# Patient Record
Sex: Female | Born: 1960 | Race: White | Hispanic: No | Marital: Married | Smoking: Never smoker
Health system: Southern US, Community
[De-identification: ages and names within clinical notes are randomized; demographics above are authoritative.]

---
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
## 1999-02-08 HISTORY — PX: ABDOMINAL HYSTERECTOMY: SHX81

## 2004-02-08 ENCOUNTER — Encounter (INDEPENDENT_AMBULATORY_CARE_PROVIDER_SITE_OTHER): Payer: Self-pay | Admitting: Family Medicine

## 2004-02-08 LAB — CONVERTED CEMR LAB: Pap Smear: NORMAL

## 2004-04-13 ENCOUNTER — Ambulatory Visit: Payer: Self-pay | Admitting: Family Medicine

## 2004-04-19 ENCOUNTER — Ambulatory Visit (HOSPITAL_COMMUNITY): Admission: RE | Admit: 2004-04-19 | Discharge: 2004-04-19 | Payer: Self-pay | Admitting: Family Medicine

## 2004-05-20 ENCOUNTER — Ambulatory Visit: Payer: Self-pay | Admitting: Family Medicine

## 2004-11-18 ENCOUNTER — Ambulatory Visit: Payer: Self-pay | Admitting: Family Medicine

## 2005-07-07 ENCOUNTER — Ambulatory Visit (HOSPITAL_COMMUNITY): Admission: RE | Admit: 2005-07-07 | Discharge: 2005-07-07 | Payer: Self-pay | Admitting: Internal Medicine

## 2006-01-17 ENCOUNTER — Ambulatory Visit: Payer: Self-pay | Admitting: Family Medicine

## 2006-01-21 ENCOUNTER — Encounter: Payer: Self-pay | Admitting: Family Medicine

## 2006-01-21 DIAGNOSIS — D649 Anemia, unspecified: Secondary | ICD-10-CM | POA: Insufficient documentation

## 2006-01-21 DIAGNOSIS — Z85828 Personal history of other malignant neoplasm of skin: Secondary | ICD-10-CM

## 2006-01-21 DIAGNOSIS — J309 Allergic rhinitis, unspecified: Secondary | ICD-10-CM | POA: Insufficient documentation

## 2006-09-18 ENCOUNTER — Ambulatory Visit: Payer: Self-pay | Admitting: Internal Medicine

## 2006-09-18 DIAGNOSIS — L255 Unspecified contact dermatitis due to plants, except food: Secondary | ICD-10-CM | POA: Insufficient documentation

## 2006-09-21 ENCOUNTER — Encounter (INDEPENDENT_AMBULATORY_CARE_PROVIDER_SITE_OTHER): Payer: Self-pay | Admitting: Internal Medicine

## 2006-09-21 ENCOUNTER — Telehealth (INDEPENDENT_AMBULATORY_CARE_PROVIDER_SITE_OTHER): Payer: Self-pay | Admitting: *Deleted

## 2007-02-08 ENCOUNTER — Encounter: Payer: Self-pay | Admitting: Family Medicine

## 2008-08-06 ENCOUNTER — Ambulatory Visit: Payer: Self-pay | Admitting: Internal Medicine

## 2008-08-06 DIAGNOSIS — Z780 Asymptomatic menopausal state: Secondary | ICD-10-CM | POA: Insufficient documentation

## 2008-08-06 DIAGNOSIS — M67919 Unspecified disorder of synovium and tendon, unspecified shoulder: Secondary | ICD-10-CM | POA: Insufficient documentation

## 2008-08-06 DIAGNOSIS — M719 Bursopathy, unspecified: Secondary | ICD-10-CM

## 2008-08-07 LAB — CONVERTED CEMR LAB
Cholesterol: 195 mg/dL (ref 0–200)
HDL: 65 mg/dL (ref >39)

## 2008-08-12 ENCOUNTER — Ambulatory Visit (HOSPITAL_COMMUNITY): Admission: RE | Admit: 2008-08-12 | Discharge: 2008-08-12 | Payer: Self-pay | Admitting: Internal Medicine

## 2008-08-22 ENCOUNTER — Encounter: Payer: Self-pay | Admitting: Gastroenterology

## 2008-08-27 ENCOUNTER — Ambulatory Visit (HOSPITAL_COMMUNITY): Admission: RE | Admit: 2008-08-27 | Discharge: 2008-08-27 | Payer: Self-pay | Admitting: Gastroenterology

## 2008-08-27 ENCOUNTER — Ambulatory Visit: Payer: Self-pay | Admitting: Gastroenterology

## 2008-08-29 ENCOUNTER — Ambulatory Visit: Payer: Self-pay | Admitting: Internal Medicine

## 2008-08-29 DIAGNOSIS — R21 Rash and other nonspecific skin eruption: Secondary | ICD-10-CM

## 2010-02-28 ENCOUNTER — Encounter: Payer: Self-pay | Admitting: Internal Medicine

## 2010-03-09 NOTE — Letter (Signed)
Summary: rpc chart  rpc chart   Imported By: Curtis Sites 11/11/2009 17:01:38  _____________________________________________________________________  External Attachment:    Type:   Image     Comment:   External Document

## 2010-06-22 NOTE — Op Note (Signed)
Brooke Doyle, Brooke Doyle                ACCOUNT NO.:  [id number]   MEDICAL RECORD NO.:  [id number]          PATIENT TYPE:  AMB   LOCATION:  DAY                           FACILITY:  [facility]   PHYSICIAN:  Kassie Mends, M.D.      DATE OF BIRTH:  [birth year]   DATE OF PROCEDURE:  08/27/2008  DATE OF DISCHARGE:                               OPERATIVE REPORT   REFERRING PHYSICIAN:  Erle Crocker, MD   PROCEDURE:  Colonoscopy.   INDICATION FOR EXAM:  Brooke Doyle is a 50 year old female, who presents  for average risk colon cancer screening.   FINDINGS:  1. Slightly tortuous colon.  Rare diverticula seen in the transverse      and sigmoid colon.  Otherwise, no polyps, masses, inflammatory      changes, or AVMs seen.  2. Small internal hemorrhoids.  Otherwise, normal retroflex view of      the rectum.   RECOMMENDATIONS:  1. Screening colonoscopy in 10 years.  Would consider a pediatric      colonoscope/overtube.  2. She should follow a high-fiber diet.  She is given a handout on a      high-fiber diet, diverticulosis, and hemorrhoids.   MEDICATIONS:  1. Demerol 50 mg IV.  2. Versed 5 mg IV.  3. Phenergan 12.5 mg IV.   PROCEDURE TECHNIQUE:  Physical exam was performed.  Informed consent was  obtained from the patient explaining the benefits, risks, and  alternatives to the procedure.  The patient was connected to monitor and  placed in left lateral position.  Continuous oxygen was provided by  nasal cannula and IV medicine administered through an indwelling  cannula.  After administration of sedation and rectal exam, the  patient's rectum was intubated and the  scope advanced under direct visualization to the cecum.  The scope was  removed slowly by carefully examining color, texture, anatomy, and  integrity mucosa on the way out.  The patient was recovered in endoscopy  and discharged home in satisfactory condition.      Kassie Mends, M.D.  Electronically Signed     SM/MEDQ  D:   08/27/2008  T:  08/27/2008  Job:  [id number]   cc:   Erle Crocker, M.D.

## 2011-06-06 ENCOUNTER — Other Ambulatory Visit: Payer: Self-pay | Admitting: Obstetrics and Gynecology

## 2011-06-06 DIAGNOSIS — Z1231 Encounter for screening mammogram for malignant neoplasm of breast: Secondary | ICD-10-CM

## 2011-07-08 ENCOUNTER — Other Ambulatory Visit: Payer: Self-pay | Admitting: Obstetrics and Gynecology

## 2011-07-08 ENCOUNTER — Other Ambulatory Visit (HOSPITAL_COMMUNITY): Payer: Self-pay | Admitting: Obstetrics and Gynecology

## 2011-07-08 ENCOUNTER — Ambulatory Visit: Payer: Self-pay

## 2011-07-08 ENCOUNTER — Ambulatory Visit (HOSPITAL_COMMUNITY)
Admission: RE | Admit: 2011-07-08 | Discharge: 2011-07-08 | Disposition: A | Discharge status: Home / Self Care | Payer: BC Managed Care – PPO | Source: Ambulatory Visit | Attending: Obstetrics and Gynecology | Admitting: Obstetrics and Gynecology

## 2011-07-08 DIAGNOSIS — Z1231 Encounter for screening mammogram for malignant neoplasm of breast: Secondary | ICD-10-CM | POA: Insufficient documentation

## 2013-02-25 ENCOUNTER — Ambulatory Visit: Payer: BC Managed Care – PPO | Admitting: Internal Medicine

## 2014-06-10 ENCOUNTER — Other Ambulatory Visit: Payer: Self-pay | Admitting: Obstetrics and Gynecology

## 2014-06-10 DIAGNOSIS — Z1231 Encounter for screening mammogram for malignant neoplasm of breast: Secondary | ICD-10-CM

## 2014-07-25 ENCOUNTER — Ambulatory Visit (HOSPITAL_COMMUNITY)
Admission: RE | Admit: 2014-07-25 | Discharge: 2014-07-25 | Disposition: A | Discharge status: Home / Self Care | Payer: BC Managed Care – PPO | Source: Ambulatory Visit | Attending: Obstetrics and Gynecology | Admitting: Obstetrics and Gynecology

## 2014-07-25 DIAGNOSIS — Z1231 Encounter for screening mammogram for malignant neoplasm of breast: Secondary | ICD-10-CM

## 2014-07-28 ENCOUNTER — Other Ambulatory Visit: Payer: Self-pay | Admitting: Obstetrics and Gynecology

## 2014-07-28 DIAGNOSIS — R928 Other abnormal and inconclusive findings on diagnostic imaging of breast: Secondary | ICD-10-CM

## 2014-08-01 ENCOUNTER — Ambulatory Visit
Admission: RE | Admit: 2014-08-01 | Discharge: 2014-08-01 | Disposition: A | Discharge status: Home / Self Care | Payer: BC Managed Care – PPO | Source: Ambulatory Visit | Attending: Obstetrics and Gynecology | Admitting: Obstetrics and Gynecology

## 2014-08-01 DIAGNOSIS — R928 Other abnormal and inconclusive findings on diagnostic imaging of breast: Secondary | ICD-10-CM

## 2014-08-13 ENCOUNTER — Encounter: Payer: Self-pay | Admitting: *Deleted

## 2014-08-14 ENCOUNTER — Ambulatory Visit (INDEPENDENT_AMBULATORY_CARE_PROVIDER_SITE_OTHER): Payer: BC Managed Care – PPO | Admitting: Cardiology

## 2014-08-14 ENCOUNTER — Encounter: Payer: Self-pay | Admitting: *Deleted

## 2014-08-14 VITALS — BP 99/63 | HR 74 | Ht 65.0 in | Wt 141.0 lb

## 2014-08-14 DIAGNOSIS — M79606 Pain in leg, unspecified: Secondary | ICD-10-CM | POA: Diagnosis not present

## 2014-08-14 DIAGNOSIS — R0789 Other chest pain: Secondary | ICD-10-CM

## 2014-08-14 DIAGNOSIS — Z136 Encounter for screening for cardiovascular disorders: Secondary | ICD-10-CM | POA: Diagnosis not present

## 2014-08-14 NOTE — Patient Instructions (Signed)
Your physician recommends that you schedule a follow-up appointment in: 1 year. You will receive a reminder letter in the mail in about 10 months reminding you to call and schedule your appointment. If you don't receive this letter, please contact our office.

## 2014-08-14 NOTE — Progress Notes (Signed)
Patient ID: Brooke Doyle, female   DOB: 04-Nov-1960, 54 y.o.   MRN: [id number]     Clinical Summary Brooke Doyle is a 54 y.o.female seen today as a new patient for the following medical problems.   1. Leg pain - left leg throbbing pain just above ankle. Noticed one day when she had been on her feet for a long period of time at her job. She questions if it could be circulation related.    2. Chest pain - shooting pain a few months ago, going to jaw - first episode while walking in from playground at work. Opened door, felt shooting like pain that lasted just a few seconds. No other symptoms.   - 2nd episode while in kitchen. Left jaw pain, no other symptoms. Lasted just a few seconds.  - 30 min does fast walking daily, including up and down stairs. Tolerates well without any symptoms  CAD risk factors:  Maternal aunt and uncle with heart diasease       No past medical history on file.   No Known Allergies   No current outpatient prescriptions on file.   No current facility-administered medications for this visit.     Past Surgical History  Procedure Laterality Date  . Abdominal hysterectomy      due to heavy bleeding  . Cesarean section       No Known Allergies    Family History  Problem Relation Age of Onset  . Skin cancer Father   . Breast cancer Other     grandmother     Social History Brooke Doyle reports that she has never smoked. She has never used smokeless tobacco. Brooke Doyle has no alcohol history on file.   Review of Systems CONSTITUTIONAL: No weight loss, fever, chills, weakness or fatigue.  HEENT: Eyes: No visual loss, blurred vision, double vision or yellow sclerae.No hearing loss, sneezing, congestion, runny nose or sore throat.  SKIN: No rash or itching.  CARDIOVASCULAR: per HPI RESPIRATORY: No shortness of breath, cough or sputum.  GASTROINTESTINAL: No anorexia, nausea, vomiting or diarrhea. No abdominal pain or blood.  GENITOURINARY: No  burning on urination, no polyuria NEUROLOGICAL: No headache, dizziness, syncope, paralysis, ataxia, numbness or tingling in the extremities. No change in bowel or bladder control.  MUSCULOSKELETAL: per HPI LYMPHATICS: No enlarged nodes. No history of splenectomy.  PSYCHIATRIC: No history of depression or anxiety.  ENDOCRINOLOGIC: No reports of sweating, cold or heat intolerance. No polyuria or polydipsia.  Marland Kitchen.   Physical Examination Filed Vitals:   08/14/14 1407  BP: 99/63  Pulse: 74   Filed Vitals:   08/14/14 1407  Height: 5\' 5"  (1.651 m)  Weight: 141 lb (63.957 kg)    Gen: resting comfortably, no acute distress HEENT: no scleral icterus, pupils equal round and reactive, no palptable cervical adenopathy,  CV: RRR, no m/r/g, noJVD. 2+ bilateral DP/PT pulses Resp: Clear to auscultation bilaterally GI: abdomen is soft, non-tender, non-distended, normal bowel sounds, no hepatosplenomegaly MSK: extremities are warm, no edema.  Skin: warm, no rash Neuro:  no focal deficits Psych: appropriate affect     Assessment and Plan  1. Leg pain - isolated episode, probably MSK pain. Not consistent with vascular etiology. No further workup at this time  2. Atypical chest pain - noncardiac chest pain symptoms, very limited CAD risk factors. Tolerates moderate to high levels of exercise regularly without any symptoms - continue to follow clinically. Obtain old [facility] records, she reports prior cardiac testing.  F/u 1 year   Antoine Poche, MD

## 2014-08-28 ENCOUNTER — Encounter: Payer: Self-pay | Admitting: *Deleted

## 2015-10-15 ENCOUNTER — Other Ambulatory Visit: Payer: Self-pay | Admitting: Obstetrics and Gynecology

## 2015-10-15 DIAGNOSIS — Z1231 Encounter for screening mammogram for malignant neoplasm of breast: Secondary | ICD-10-CM

## 2015-11-06 ENCOUNTER — Ambulatory Visit (HOSPITAL_COMMUNITY)
Admission: RE | Admit: 2015-11-06 | Discharge: 2015-11-06 | Disposition: A | Discharge status: Home / Self Care | Payer: BC Managed Care – PPO | Source: Ambulatory Visit | Attending: Obstetrics and Gynecology | Admitting: Obstetrics and Gynecology

## 2015-11-06 DIAGNOSIS — Z1231 Encounter for screening mammogram for malignant neoplasm of breast: Secondary | ICD-10-CM | POA: Insufficient documentation

## 2016-01-25 ENCOUNTER — Other Ambulatory Visit: Payer: Self-pay | Admitting: Obstetrics and Gynecology

## 2016-01-25 DIAGNOSIS — N951 Menopausal and female climacteric states: Secondary | ICD-10-CM

## 2016-01-29 ENCOUNTER — Other Ambulatory Visit: Payer: BC Managed Care – PPO

## 2016-02-02 ENCOUNTER — Encounter: Payer: Self-pay | Admitting: Skilled Nursing Facility1

## 2016-02-02 ENCOUNTER — Encounter: Payer: BC Managed Care – PPO | Attending: Obstetrics and Gynecology | Admitting: Skilled Nursing Facility1

## 2016-02-02 DIAGNOSIS — R7303 Prediabetes: Secondary | ICD-10-CM

## 2016-02-02 DIAGNOSIS — Z713 Dietary counseling and surveillance: Secondary | ICD-10-CM | POA: Insufficient documentation

## 2016-02-02 NOTE — Patient Instructions (Signed)
-  Try to Do resistance exercises 2-3 days a week -Aim to get sweaty and out of breath for your activities

## 2016-02-02 NOTE — Progress Notes (Signed)
  Medical Nutrition Therapy:  Appt start time: 11:00 end time:  11:36   Assessment:  Primary concerns today: referred or prediabetes.  Pts A1C is 5.8. Pt is not too concerned but she had a few questions to make sure she was on track with her diet.  Pt has made good healthy choices to her diet that she is also content with. 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   Preferred Learning Style:   No preference indicated   Learning Readiness:   Change in progress  MEDICATIONS: Supplements: calcium and vitamin D   DIETARY INTAKE:  Usual eating pattern includes 3 meals and 2 snacks per day.  Everyday foods include none stated.  Avoided foods include none stated.    24-hr recall:  B ( 5AM): oatmeal with 1 cup milk and peanutbutter Snk ( 8:15AM): nuts and fruit L ( PM): nuts and fruit and carrots Snk ( PM): fruit D ( 4:30 PM): meat, potatoes Snk ( PM): 1 cup of milk Beverages: coffee, milk, juice, water  Usual physical activity: 30 minutes walking a day   Progress Towards Goal(s):  In progress.   Nutritional Diagnosis:  NB-1.1 Food and nutrition-related knowledge deficit As related to newly diagnosed prediabetes.  As evidenced by referral for prediabetes and pt report.    Intervention:  Nutrition counseling for prediabetes. Dietitian educated the pt on prediabetes and small tweaks to help her stay on track. Goals: -Try to Do resistance exercises 2-3 days a week -Aim to get sweaty and out of breath for your activities   Teaching Method Utilized: Visual Auditory  Barriers to learning/adherence to lifestyle change: none identified   Demonstrated degree of understanding via:  Teach Back   Monitoring/Evaluation:  Dietary intake, exercise, A1C, and body weight prn.

## 2017-12-18 ENCOUNTER — Other Ambulatory Visit (HOSPITAL_COMMUNITY): Payer: Self-pay | Admitting: Physician Assistant

## 2017-12-18 DIAGNOSIS — Z1231 Encounter for screening mammogram for malignant neoplasm of breast: Secondary | ICD-10-CM

## 2018-02-12 ENCOUNTER — Ambulatory Visit (HOSPITAL_COMMUNITY)
Admission: RE | Admit: 2018-02-12 | Discharge: 2018-02-12 | Disposition: A | Discharge status: Home / Self Care | Payer: BC Managed Care – PPO | Source: Ambulatory Visit | Attending: Physician Assistant | Admitting: Physician Assistant

## 2018-02-12 DIAGNOSIS — Z1231 Encounter for screening mammogram for malignant neoplasm of breast: Secondary | ICD-10-CM | POA: Diagnosis present

## 2018-03-19 ENCOUNTER — Other Ambulatory Visit (HOSPITAL_COMMUNITY): Payer: Self-pay | Admitting: Physician Assistant

## 2018-03-19 DIAGNOSIS — M8580 Other specified disorders of bone density and structure, unspecified site: Secondary | ICD-10-CM

## 2018-04-03 ENCOUNTER — Ambulatory Visit (HOSPITAL_COMMUNITY)
Admission: RE | Admit: 2018-04-03 | Discharge: 2018-04-03 | Disposition: A | Discharge status: Home / Self Care | Payer: BC Managed Care – PPO | Source: Ambulatory Visit | Attending: Physician Assistant | Admitting: Physician Assistant

## 2018-04-03 ENCOUNTER — Telehealth: Payer: Self-pay | Admitting: Cardiology

## 2018-04-03 DIAGNOSIS — M8580 Other specified disorders of bone density and structure, unspecified site: Secondary | ICD-10-CM | POA: Diagnosis not present

## 2018-04-03 NOTE — Telephone Encounter (Signed)
FYI.  °Contacted patient regarding recall appointment, patient notified our office they did not wish to keep this appointment at this time.  Deleted recall from system. °

## 2018-08-29 ENCOUNTER — Other Ambulatory Visit: Payer: Self-pay

## 2018-08-29 ENCOUNTER — Ambulatory Visit (INDEPENDENT_AMBULATORY_CARE_PROVIDER_SITE_OTHER): Payer: BC Managed Care – PPO | Admitting: *Deleted

## 2018-08-29 ENCOUNTER — Telehealth: Payer: Self-pay | Admitting: *Deleted

## 2018-08-29 DIAGNOSIS — Z1211 Encounter for screening for malignant neoplasm of colon: Secondary | ICD-10-CM

## 2018-08-29 MED ORDER — NA SULFATE-K SULFATE-MG SULF 17.5-3.13-1.6 GM/177ML PO SOLN: 1.0000 | Freq: Once | ORAL | 0 refills | Status: AC

## 2018-08-29 NOTE — Progress Notes (Signed)
How much Zofran would you like administered?  I'll put in the pre-procedure order for it.

## 2018-08-29 NOTE — Telephone Encounter (Signed)
Pt called back and we completed her nurse triage visit by phone today.  Instructions, pre-procedure acknowledgments, and COVID screening were discussed.  Pt voiced understanding.  Pt is aware that we will mail out all info discussed by phone.

## 2018-08-29 NOTE — Patient Instructions (Signed)
Brooke Doyle  1960/10/31 MRN: [id number]     Procedure Date: 10/17/2018 Time to register: 8:30 am Place to register: [facility] Procedure Time: 9:30 am Scheduled provider: Dr. Oneida Alar    PREPARATION FOR COLONOSCOPY WITH SUPREP BOWEL PREP KIT  Note: Suprep Bowel Prep Kit is a split-dose (2day) regimen. Consumption of BOTH 6-ounce bottles is required for a complete prep.  Please notify us immediately if you are diabetic, take iron supplements, or if you are on Coumadin or any other blood thinners.                                                                                                                                                   1 DAY BEFORE PROCEDURE:  DATE: 10/16/2018   DAY: Tuesday Continue clear liquids the entire day - NO SOLID FOOD.     At 6:00pm: Complete steps 1 through 4 below, using ONE (1) 6-ounce bottle, before going to bed. Step 1:  Pour ONE (1) 6-ounce bottle of SUPREP liquid into the mixing container.  Step 2:  Add cool drinking water to the 16 ounce line on the container and mix.  Note: Dilute the solution concentrate as directed prior to use. Step 3:  DRINK ALL the liquid in the container. Step 4:  You MUST drink an additional two (2) or more 16 ounce containers of water over the next one (1) hour.   Continue clear liquids.  DAY OF PROCEDURE:   DATE: 10/17/2018   DAY: Wednesday If you take medications for your heart, blood pressure, or breathing, you may take these medications.    5 hours before your procedure at : 4:30 am Step 1:  Pour ONE (1) 6-ounce bottle of SUPREP liquid into the mixing container.  Step 2:  Add cool drinking water to the 16 ounce line on the container and mix.  Note: Dilute the solution concentrate as directed prior to use. Step 3:  DRINK ALL the liquid in the container. Step 4:  You MUST drink an additional two (2) or more 16 ounce containers of water over the next one (1) hour. You MUST complete the final glass of  water at least 3 hours before your colonoscopy. Nothing by mouth past 6:30 am  You may take your morning medications with sip of water unless we have instructed otherwise.    Please see below for Dietary Information.  CLEAR LIQUIDS INCLUDE:  Water Jello (NOT red in color)   Ice Popsicles (NOT red in color)   Tea (sugar ok, no milk/cream) Powdered fruit flavored drinks  Coffee (sugar ok, no milk/cream) Gatorade/ Lemonade/ Kool-Aid  (NOT red in color)   Juice: apple, white grape, white cranberry Soft drinks  Clear bullion, consomme, broth (fat free beef/chicken/vegetable)  Carbonated beverages (any kind)  Strained chicken noodle soup Hard Candy  Remember: Clear liquids are liquids that will allow you to see your fingers on the other side of a clear glass. Be sure liquids are NOT red in color, and not cloudy, but CLEAR.  DO NOT EAT OR DRINK ANY OF THE FOLLOWING:  Dairy products of any kind   Cranberry juice Tomato juice / V8 juice   Grapefruit juice Orange juice     Red grape juice  Do not eat any solid foods, including such foods as: cereal, oatmeal, yogurt, fruits, vegetables, creamed soups, eggs, bread, crackers, pureed foods in a blender, etc.   HELPFUL HINTS FOR DRINKING PREP SOLUTION:   Make sure prep is extremely cold. Mix and refrigerate the the morning of the prep. You may also put in the freezer.   You may try mixing some Crystal Light or Country Time Lemonade if you prefer. Mix in small amounts; add more if necessary.  Try drinking through a straw  Rinse mouth with water or a mouthwash between glasses, to remove after-taste.  Try sipping on a cold beverage /ice/ popsicles between glasses of prep.  Place a piece of sugar-free hard candy in mouth between glasses.  If you become nauseated, try consuming smaller amounts, or stretch out the time between glasses. Stop for 30-60 minutes, then slowly start back drinking.     OTHER INSTRUCTIONS  You will need a  responsible adult at least 60 years of age to accompany you and drive you home. This person must remain in the waiting room during your procedure. The hospital will cancel your procedure if you do not have a responsible adult with you.   1. Wear loose fitting clothing that is easily removed. 2. Leave jewelry and other valuables at home.  3. Remove all body piercing jewelry and leave at home. 4. Total time from sign-in until discharge is approximately 2-3 hours. 5. You should go home directly after your procedure and rest. You can resume normal activities the day after your procedure. 6. The day of your procedure you should not:  Drive  Make legal decisions  Operate machinery  Drink alcohol  Return to work   You may call the office (Dept: [phone]) before 5:00pm, or page the doctor on call ([phone]) after 5:00pm, for further instructions, if necessary.   Insurance Information YOU WILL NEED TO CHECK WITH YOUR INSURANCE COMPANY FOR THE BENEFITS OF COVERAGE YOU HAVE FOR THIS PROCEDURE.  UNFORTUNATELY, NOT ALL INSURANCE COMPANIES HAVE BENEFITS TO COVER ALL OR PART OF THESE TYPES OF PROCEDURES.  IT IS YOUR RESPONSIBILITY TO CHECK YOUR BENEFITS, HOWEVER, WE WILL BE GLAD TO ASSIST YOU WITH ANY CODES YOUR INSURANCE COMPANY MAY NEED.    PLEASE NOTE THAT MOST INSURANCE COMPANIES WILL NOT COVER A SCREENING COLONOSCOPY FOR PEOPLE UNDER THE AGE OF 50  IF YOU HAVE BCBS INSURANCE, YOU MAY HAVE BENEFITS FOR A SCREENING COLONOSCOPY BUT IF POLYPS ARE FOUND THE DIAGNOSIS WILL CHANGE AND THEN YOU MAY HAVE A DEDUCTIBLE THAT WILL NEED TO BE MET. SO PLEASE MAKE SURE YOU CHECK YOUR BENEFITS FOR A SCREENING COLONOSCOPY AS WELL AS A DIAGNOSTIC COLONOSCOPY.      

## 2018-08-29 NOTE — Telephone Encounter (Signed)
Called pt for her 9:00 nurse triage visit by phone.  LMOM of home and cell phone for pt to call us back.

## 2018-08-29 NOTE — Progress Notes (Signed)
Pt remembered a medication that she was taking before we hung up.  Added Ibandronate 150 mg to her medication list.

## 2018-08-29 NOTE — Progress Notes (Signed)
Ok to schedule.  Please make note of likely need for pre-procedure Zofran d/t history of N/V with anesthesia

## 2018-08-29 NOTE — Progress Notes (Signed)
Gastroenterology Pre-Procedure Review  Request Date: 08/29/2018 Requesting Physician: Dr. Barth Kirks Redmon @ [facility], Last TCS 08/27/2008 done by Dr. Oneida Alar, Small internal hemorrhoids, rare diverticula, no polyps  PATIENT REVIEW QUESTIONS: The patient responded to the following health history questions as indicated:    1. Diabetes Melitis: No 2. Joint replacements in the past 12 months: No 3. Major health problems in the past 3 months: No 4. Has an artificial valve or MVP: No 5. Has a defibrillator: No 6. Has been advised in past to take antibiotics in advance of a procedure like teeth cleaning: No 7. Family history of colon cancer: No  8. Alcohol Use: No 9. History of sleep apnea: No  10. History of coronary artery or other vascular stents placed within the last 12 months: No 11. History of any prior anesthesia complications: Yes, nausea and vomiting    MEDICATIONS & ALLERGIES:    Patient reports the following regarding taking any blood thinners:   Plavix? No Aspirin? No Coumadin? No Brilinta? No Xarelto? No Eliquis? No Pradaxa? No Savaysa? No Effient? No  Patient confirms/reports the following medications:  Current Outpatient Medications  Medication Sig Dispense Refill  . calcium carbonate (CALCIUM 600) 1500 (600 Ca) MG TABS tablet Take by mouth 2 (two) times daily with a meal.    . Cholecalciferol (D3 HIGH POTENCY) 125 MCG (5000 UT) capsule Take 5,000 Units by mouth daily.    . Multiple Vitamins-Minerals (ONE-A-DAY WOMENS 50 PLUS PO) Take by mouth daily.    . vitamin C (ASCORBIC ACID) 500 MG tablet Take 500 mg by mouth daily.    Marland Kitchen zinc gluconate 50 MG tablet Take 25 mg by mouth daily.     No current facility-administered medications for this visit.     Patient confirms/reports the following allergies:  No Known Allergies  No orders of the defined types were placed in this encounter.   AUTHORIZATION INFORMATION Primary Insurance: [place],  ID#:  [id number] ,  Group #:[id number] Pre-Cert / Auth required: No not required   SCHEDULE INFORMATION: Procedure has been scheduled as follows:  Date: 10/17/2018, Time: 9:30 Location: [facility] with Dr. Oneida Alar  This Gastroenterology Pre-Precedure Review Form is being routed to the following provider(s): Walden Field, NP

## 2018-08-29 NOTE — Addendum Note (Signed)
Addended by: Metro Kung on: 08/29/2018 02:25 PM   Modules accepted: Miquel Dunn

## 2018-08-30 NOTE — Progress Notes (Signed)
Per EG, pt will need Zofran 4 mg to start off with and [facility] can decide if more will be needed.

## 2018-08-30 NOTE — Addendum Note (Signed)
Addended by: Metro Kung on: 08/30/2018 02:45 PM   Modules accepted: Orders, SmartSet

## 2018-08-30 NOTE — Progress Notes (Signed)
Noted. Still ok to schedule

## 2018-09-17 NOTE — Progress Notes (Signed)
Spoke with pt and informed her that we needed to re-schedule her Covid screening due to the testing center being closed due to Labor Day.  Pt aware of new appt on 10/16/2018 at 8:10.  Pt voiced understanding.

## 2018-10-04 ENCOUNTER — Telehealth: Payer: Self-pay | Admitting: Gastroenterology

## 2018-10-04 NOTE — Telephone Encounter (Signed)
Lmom for pt to call me back. 

## 2018-10-04 NOTE — Telephone Encounter (Signed)
Pt said her pharmacy hasn't received her prep prescription yet/ She uses [facility] in [place]. Please call her to let her know it was called in. [phone]

## 2018-10-05 NOTE — Telephone Encounter (Signed)
Spoke with pt and she is aware that RX was sent to [facility] in [place] back in July.  Pt informed to call me if she has any trouble getting her prep kit or if we need to send a new RX.  Pt voiced understanding.

## 2018-10-15 ENCOUNTER — Other Ambulatory Visit (HOSPITAL_COMMUNITY): Payer: BC Managed Care – PPO

## 2018-10-16 ENCOUNTER — Other Ambulatory Visit: Payer: Self-pay

## 2018-10-16 ENCOUNTER — Other Ambulatory Visit (HOSPITAL_COMMUNITY)
Admission: RE | Admit: 2018-10-16 | Discharge: 2018-10-16 | Disposition: A | Discharge status: Home / Self Care | Payer: BC Managed Care – PPO | Source: Ambulatory Visit | Attending: Gastroenterology | Admitting: Gastroenterology

## 2018-10-16 DIAGNOSIS — Z01812 Encounter for preprocedural laboratory examination: Secondary | ICD-10-CM | POA: Insufficient documentation

## 2018-10-16 DIAGNOSIS — Z20828 Contact with and (suspected) exposure to other viral communicable diseases: Secondary | ICD-10-CM | POA: Insufficient documentation

## 2018-10-17 ENCOUNTER — Encounter (HOSPITAL_COMMUNITY): Payer: Self-pay | Admitting: *Deleted

## 2018-10-17 ENCOUNTER — Other Ambulatory Visit: Payer: Self-pay

## 2018-10-17 ENCOUNTER — Encounter (HOSPITAL_COMMUNITY)
Admission: RE | Disposition: A | Discharge status: Home / Self Care | Payer: Self-pay | Source: Home / Self Care | Attending: Gastroenterology

## 2018-10-17 ENCOUNTER — Ambulatory Visit (HOSPITAL_COMMUNITY)
Admission: RE | Admit: 2018-10-17 | Discharge: 2018-10-17 | Disposition: A | Discharge status: Home / Self Care | Payer: BC Managed Care – PPO | Attending: Gastroenterology | Admitting: Gastroenterology

## 2018-10-17 DIAGNOSIS — Z79899 Other long term (current) drug therapy: Secondary | ICD-10-CM | POA: Insufficient documentation

## 2018-10-17 DIAGNOSIS — K621 Rectal polyp: Secondary | ICD-10-CM

## 2018-10-17 DIAGNOSIS — Z1211 Encounter for screening for malignant neoplasm of colon: Secondary | ICD-10-CM

## 2018-10-17 DIAGNOSIS — K644 Residual hemorrhoidal skin tags: Secondary | ICD-10-CM | POA: Diagnosis not present

## 2018-10-17 DIAGNOSIS — Z7983 Long term (current) use of bisphosphonates: Secondary | ICD-10-CM | POA: Diagnosis not present

## 2018-10-17 DIAGNOSIS — K5730 Diverticulosis of large intestine without perforation or abscess without bleeding: Secondary | ICD-10-CM | POA: Insufficient documentation

## 2018-10-17 DIAGNOSIS — Q438 Other specified congenital malformations of intestine: Secondary | ICD-10-CM | POA: Insufficient documentation

## 2018-10-17 HISTORY — PX: POLYPECTOMY: SHX5525

## 2018-10-17 HISTORY — PX: COLONOSCOPY: SHX5424

## 2018-10-17 LAB — SARS CORONAVIRUS 2 (TAT 6-24 HRS): SARS Coronavirus 2: NEGATIVE

## 2018-10-17 SURGERY — COLONOSCOPY
Anesthesia: Moderate Sedation

## 2018-10-17 MED ORDER — SODIUM CHLORIDE 0.9 % IV SOLN
INTRAVENOUS | Status: DC
  Administered 2018-10-17: 09:00:00 via INTRAVENOUS

## 2018-10-17 MED ORDER — ONDANSETRON HCL 4 MG/2ML IJ SOLN
4.0000 mg | Freq: Once | INTRAMUSCULAR | Status: AC
  Administered 2018-10-17: 4 mg via INTRAVENOUS

## 2018-10-17 MED ORDER — MIDAZOLAM HCL 5 MG/5ML IJ SOLN
INTRAMUSCULAR | Status: AC
  Filled 2018-10-17: qty 5

## 2018-10-17 MED ORDER — MEPERIDINE HCL 100 MG/ML IJ SOLN
INTRAMUSCULAR | Status: AC
  Filled 2018-10-17: qty 1

## 2018-10-17 MED ORDER — ONDANSETRON HCL 4 MG/2ML IJ SOLN
INTRAMUSCULAR | Status: AC
  Filled 2018-10-17: qty 2

## 2018-10-17 MED ORDER — MEPERIDINE HCL 100 MG/ML IJ SOLN
INTRAMUSCULAR | Status: DC | PRN
  Administered 2018-10-17 (×4): 25 mg

## 2018-10-17 MED ORDER — MIDAZOLAM HCL 5 MG/5ML IJ SOLN
INTRAMUSCULAR | Status: DC | PRN
  Administered 2018-10-17: 2 mg via INTRAVENOUS
  Administered 2018-10-17: 1 mg via INTRAVENOUS
  Administered 2018-10-17: 2 mg via INTRAVENOUS

## 2018-10-17 NOTE — Op Note (Signed)
[facility] Patient Name: Brooke Doyle Procedure Date: 10/17/2018 9:34 AM MRN: [id number] Date of Birth: 01/08/1961 Attending MD: Jonette Eva MD, MD CSN: [id number] Age: 58 Admit Type: Outpatient Procedure:                Colonoscopy WITH COLS SNARE POLYPECTOMY Indications:              Screening for colorectal malignant neoplasm Providers:                Jonette Eva MD, MD, Jannett Celestine, RN, Dyann Ruddle Referring MD:             Milus Height Medicines:                Meperidine 100 mg IV, Midazolam 5 mg IV,                            Ondansetron 4 mg IV Complications:            No immediate complications. Estimated Blood Loss:     Estimated blood loss was minimal. Procedure:                Pre-Anesthesia Assessment:                           - Prior to the procedure, a History and Physical                            was performed, and patient medications and                            allergies were reviewed. The patient's tolerance of                            previous anesthesia was also reviewed. The risks                            and benefits of the procedure and the sedation                            options and risks were discussed with the patient.                            All questions were answered, and informed consent                            was obtained. Prior Anticoagulants: The patient has                            taken no previous anticoagulant or antiplatelet                            agents. ASA Grade Assessment: I - A normal, healthy                            patient. After reviewing the risks and benefits,  the patient was deemed in satisfactory condition to                            undergo the procedure. After obtaining informed                            consent, the colonoscope was passed under direct                            vision. Throughout the procedure, the patient's                            blood  pressure, pulse, and oxygen saturations were                            monitored continuously. The [id number] ([id number]                            scope was introduced through the anus and advanced                            to the the cecum, identified by appendiceal orifice                            and ileocecal valve. The colonoscopy was somewhat                            difficult due to restricted mobility of the colon                            and a tortuous colon. Successful completion of the                            procedure was aided by straightening and shortening                            the scope to obtain bowel loop reduction and                            COLOWRAP. The patient tolerated the procedure                            fairly well. The quality of the bowel preparation                            was excellent. The ileocecal valve, appendiceal                            orifice, and rectum were photographed. Scope In: 10:03:56 AM Scope Out: 10:21:54 AM Scope Withdrawal Time: 0 hours 12 minutes 35 seconds  Total Procedure Duration: 0 hours 17 minutes 58 seconds  Findings:      A 3 mm polyp was found in the rectum. The polyp was sessile. The polyp  was removed with a cold snare. Resection and retrieval were complete.      Multiple small and large-mouthed diverticula were found in the       recto-sigmoid colon, sigmoid colon and distal transverse colon.      External hemorrhoids were found. The hemorrhoids were moderate.      The recto-sigmoid colon, sigmoid colon, descending colon and splenic       flexure were grossly tortuous. Impression:               - One 3 mm polyp in the rectum, removed with a cold                            snare. Resected and retrieved.                           - Diverticulosis in the recto-sigmoid colon, in the                            sigmoid colon and in the distal transverse colon.                           - External  hemorrhoids.                           - Tortuous colon. Moderate Sedation:      Moderate (conscious) sedation was administered by the endoscopy nurse       and supervised by the endoscopist. The following parameters were       monitored: oxygen saturation, heart rate, blood pressure, and response       to care. Total physician intraservice time was 32 minutes. Recommendation:           - Patient has a contact number available for                            emergencies. The signs and symptoms of potential                            delayed complications were discussed with the                            patient. Return to normal activities tomorrow.                            Written discharge instructions were provided to the                            patient.                           - High fiber diet.                           - Continue present medications.                           - Await pathology results.                           -  Repeat colonoscopy in 5-10 years for surveillance. Procedure Code(s):        --- Professional ---                           [phone], Colonoscopy, flexible; with removal of                            tumor(s), polyp(s), or other lesion(s) by snare                            technique                           99153, Moderate sedation; each additional 15                            minutes intraservice time                           G0500, Moderate sedation services provided by the                            same physician or other qualified health care                            professional performing a gastrointestinal                            endoscopic service that sedation supports,                            requiring the presence of an independent trained                            observer to assist in the monitoring of the                            patient's level of consciousness and physiological                            status; initial  15 minutes of intra-service time;                            patient age 12 years or older (additional time may                            be reported with 06269, as appropriate) Diagnosis Code(s):        --- Professional ---                           Z12.11, Encounter for screening for malignant                            neoplasm of colon  K62.1, Rectal polyp                           K64.4, Residual hemorrhoidal skin tags                           K57.30, Diverticulosis of large intestine without                            perforation or abscess without bleeding                           Q43.8, Other specified congenital malformations of                            intestine CPT copyright 2019 American [employer] Association. All rights reserved. The codes documented in this report are preliminary and upon coder review may  be revised to meet current compliance requirements. Barney Drain, MD Barney Drain MD, MD 10/17/2018 10:34:36 AM This report has been signed electronically. Number of Addenda: 0

## 2018-10-17 NOTE — Discharge Instructions (Signed)
You have small internal hemorrhoids and diverticulosis IN YOUR LEFT COLON. YOU HAD ONE SMALL POLYP REMOVED.  ° ° °DRINK WATER TO KEEP YOUR URINE LIGHT YELLOW. ° °FOLLOW A HIGH FIBER DIET. AVOID ITEMS THAT CAUSE BLOATING. See info below. ° ° °USE PREPARATION H FOUR TIMES  A DAY IF NEEDED TO RELIEVE RECTAL PAIN/PRESSURE/BLEEDING. ° ° °YOUR BIOPSY RESULTS WILL BE BACK IN 5 BUSINESS DAYS. ° °Next colonoscopy in 5-10 years. ° °Colonoscopy °Care After °Read the instructions outlined below and refer to this sheet in the next week. These discharge instructions provide you with general information on caring for yourself after you leave the hospital. While your treatment has been planned according to the most current medical practices available, unavoidable complications occasionally occur. If you have any problems or questions after discharge, call DR. Tiawana Forgy, [phone]. ° °ACTIVITY °· You may resume your regular activity, but move at a slower pace for the next 24 hours.  °· Take frequent rest periods for the next 24 hours.  °· Walking will help get rid of the air and reduce the bloated feeling in your belly (abdomen).  °· No driving for 24 hours (because of the medicine (anesthesia) used during the test).  °· You may shower.  °· Do not sign any important legal documents or operate any machinery for 24 hours (because of the anesthesia used during the test).  °·  °NUTRITION °· Drink plenty of fluids.  °· You may resume your normal diet as instructed by your doctor.  °· Begin with a light meal and progress to your normal diet. Heavy or fried foods are harder to digest and may make you feel sick to your stomach (nauseated).  °· Avoid alcoholic beverages for 24 hours or as instructed.  °·  °MEDICATIONS °· You may resume your normal medications. °·  °WHAT YOU CAN EXPECT TODAY °· Some feelings of bloating in the abdomen.  °· Passage of more gas than usual.  °· Spotting of blood in your stool or on the toilet paper °· .  °IF YOU  HAD POLYPS REMOVED DURING THE COLONOSCOPY: °· Eat a soft diet IF YOU HAVE NAUSEA, BLOATING, ABDOMINAL PAIN, OR VOMITING. °·   °FINDING OUT THE RESULTS OF YOUR TEST °Not all test results are available during your visit. DR. Keyler Hoge WILL CALL YOU WITHIN 14 DAYS OF YOUR PROCEDUE WITH YOUR RESULTS. Do not assume everything is normal if you have not heard from DR. Andrue Dini, CALL HER OFFICE AT [phone]. ° °SEEK IMMEDIATE MEDICAL ATTENTION AND CALL THE OFFICE: [phone] IF: °· You have more than a spotting of blood in your stool.  °· Your belly is swollen (abdominal distention).  °· You are nauseated or vomiting.  °· You have a temperature over 101F.  °· You have abdominal pain or discomfort that is severe or gets worse throughout the day. ° °High-Fiber Diet °A high-fiber diet changes your normal diet to include more whole grains, legumes, fruits, and vegetables. Changes in the diet involve replacing refined carbohydrates with unrefined foods. The calorie level of the diet is essentially unchanged. The Dietary Reference Intake (recommended amount) for adult males is 38 grams per day. For adult females, it is 25 grams per day. Pregnant and lactating women should consume 28 grams of fiber per day. Fiber is the intact part of a plant that is not broken down during digestion. Functional fiber is fiber that has been isolated from the plant to provide a beneficial effect in the body. ° °PURPOSE °·   Increase stool bulk.  °· Ease and regulate bowel movements.  °· Lower cholesterol.  °· REDUCE RISK OF COLON CANCER ° °INDICATIONS THAT YOU NEED MORE FIBER °· Constipation and hemorrhoids.  °· Uncomplicated diverticulosis (intestine condition) and irritable bowel syndrome.  °· Weight management.  °· As a protective measure against hardening of the arteries (atherosclerosis), diabetes, and cancer.  ° °GUIDELINES FOR INCREASING FIBER IN THE DIET °· Start adding fiber to the diet slowly. A gradual increase of about 5 more grams (2  servings of most fruits or vegetables) per day is best. Too rapid an increase in fiber may result in constipation, flatulence, and bloating.  °· Drink enough water and fluids to keep your urine clear or pale yellow. Water, juice, or caffeine-free drinks are recommended. Not drinking enough fluid may cause constipation.  °· Eat a variety of high-fiber foods rather than one type of fiber.  °· Try to increase your intake of fiber through using high-fiber foods rather than fiber pills or supplements that contain small amounts of fiber.  °· The goal is to change the types of food eaten. Do not supplement your present diet with high-fiber foods, but replace foods in your present diet.  ° ° ° °Polyps, Colon  °A polyp is extra tissue that grows inside your body. Colon polyps grow in the large intestine. The large intestine, also called the colon, is part of your digestive system. It is a long, hollow tube at the end of your digestive tract where your body makes and stores stool. °Most polyps are not dangerous. They are benign. This means they are not cancerous. But over time, some types of polyps can turn into cancer. Polyps that are smaller than a pea are usually not harmful. But larger polyps could someday become or may already be cancerous. To be safe, doctors remove all polyps and test them.  ° °PREVENTION °There is not one sure way to prevent polyps. You might be able to lower your risk of getting them if you: °· Eat more fruits and vegetables and less fatty food.  °· Do not smoke.  °· Avoid alcohol.  °· Exercise every day.  °· Lose weight if you are overweight.  °· Eating more calcium and folate can also lower your risk of getting polyps. Some foods that are rich in calcium are milk, cheese, and broccoli. Some foods that are rich in folate are chickpeas, kidney beans, and spinach.  ° ° °Diverticulosis °Diverticulosis is a common condition that develops when small pouches (diverticula) form in the wall of the colon.  The risk of diverticulosis increases with age. It happens more often in people who eat a low-fiber diet. Most individuals with diverticulosis have no symptoms. Those individuals with symptoms usually experience belly (abdominal) pain, constipation, or loose stools (diarrhea). ° °HOME CARE INSTRUCTIONS °· Increase the amount of fiber in your diet as directed by your caregiver or dietician. This may reduce symptoms of diverticulosis.  °· Drink at least 6 to 8 glasses of water each day to prevent constipation.  °· Try not to strain when you have a bowel movement.  °· Avoiding nuts and seeds to prevent complications is NOT NECESSARY.  ° °FOODS HAVING HIGH FIBER CONTENT INCLUDE: °· Fruits. Apple, peach, pear, tangerine, raisins, prunes.  °· Vegetables. Brussels sprouts, asparagus, broccoli, cabbage, carrot, cauliflower, romaine lettuce, spinach, summer squash, tomato, winter squash, zucchini.  °· Starchy Vegetables. Baked beans, kidney beans, lima beans, split peas, lentils, potatoes (with skin).  ° °SEEK   MEDICAL CARE IF:  You develop increasing pain or severe bloating.   You have an oral temperature above 101F.   You develop vomiting or bowel movements that are bloody or black.

## 2018-10-17 NOTE — H&P (Signed)
Primary Care Physician:  Milus Heightedmon, Noelle, PA-C Primary Gastroenterologist:  Dr. Darrick PennaFields  Pre-Procedure History & Physical: HPI:  Brooke Doyle is a 58 y.o. female here for COLON CANCER SCREENING.  History reviewed. No pertinent past medical history.  Past Surgical History:  Procedure Laterality Date  . ABDOMINAL HYSTERECTOMY  2001   due to heavy bleeding  . CESAREAN SECTION  1997    Prior to Admission medications   Medication Sig Start Date End Date Taking? Authorizing Provider  calcium carbonate (CALCIUM 600) 1500 (600 Ca) MG TABS tablet Take 600 mg of elemental calcium by mouth 2 (two) times daily with a meal.    Yes [provider]  Cholecalciferol (D3 HIGH POTENCY) 125 MCG (5000 UT) capsule Take 5,000 Units by mouth daily.   Yes [provider]  ibandronate (BONIVA) 150 MG tablet Take 150 mg by mouth every 30 (thirty) days. Take in the morning with a full glass of water, on an empty stomach, and do not take anything else by mouth or lie down for the next 30 min.   Yes [provider]  Multiple Vitamins-Minerals (ONE-A-DAY WOMENS 50 PLUS PO) Take 1 tablet by mouth daily.    Yes [provider]  vitamin C (ASCORBIC ACID) 500 MG tablet Take 500 mg by mouth daily.   Yes [provider]  ZINC GLUCONATE PO Take 25 mg by mouth daily.    Yes [provider]    Allergies as of 08/30/2018  . (No Known Allergies)    Family History  Problem Relation Age of Onset  . Skin cancer Father   . Valvular heart disease Father        had valve replaced in 2007  . Breast cancer Other        grandmother  . Colon cancer Neg Hx     Social History   Socioeconomic History  . Marital status: Married    Spouse name: Not on file  . Number of children: Not on file  . Years of education: Not on file  . 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 Financial resource strain: Not on file  . Food insecurity    Worry: Not on file    Inability: Not on file  . Transportation needs    Medical: Not on file    Non-medical: Not on file  Tobacco Use  . Smoking status: Never Smoker  . Smokeless tobacco: Never Used  Substance and Sexual Activity  . Alcohol use: Never    Alcohol/week: 0.0 standard drinks    Frequency: Never  . Drug use: Never  . Sexual activity: Not on file  Lifestyle  . Physical activity    Days per week: Not on file    Minutes per session: Not on file  . Stress: Not on file  Relationships  . Social Musicianconnections    Talks on phone: Not on file    Gets together: Not on file    Attends religious service: Not on file    Active member of club or organization: Not on file    Attends meetings of clubs or organizations: Not on file    Relationship status: Not on file  . Intimate partner violence    Fear of current or ex partner: Not on file    Emotionally abused: Not on file    Physically abused: Not on  file    Forced sexual activity: Not on file  Other Topics Concern  . Not on file  Social History Narrative  . Not on file    Review of Systems: See HPI, otherwise negative ROS   Physical Exam: BP (!) 108/56   Pulse 75   Temp 98 F (36.7 C) (Oral)   Resp 16   Ht 5' 5.5" (1.664 m)   Wt 68 kg   SpO2 99%   BMI 24.58 kg/m  General:   Alert,  pleasant and cooperative in NAD Head:  Normocephalic and atraumatic. Neck:  Supple; Lungs:  Clear throughout to auscultation.    Heart:  Regular rate and rhythm. Abdomen:  Soft, nontender and nondistended. Normal bowel sounds, without guarding, and without rebound.   Neurologic:  Alert and  oriented x4;  grossly normal neurologically.  Impression/Plan:    SCREENING  Plan:  1. TCS TODAY DISCUSSED PROCEDURE, BENEFITS, & RISKS: < 1% chance of medication reaction, bleeding, perforation, ASPIRATION, or rupture of spleen/liver requiring  surgery to fix it and missed polyps < 1 cm 10-20% of the time.

## 2018-10-18 ENCOUNTER — Telehealth: Payer: Self-pay | Admitting: Gastroenterology

## 2018-10-18 NOTE — Progress Notes (Signed)
CC'D TO PCP °

## 2018-10-18 NOTE — Telephone Encounter (Signed)
Please call pt. She had ONE HYPERPLASTIC POLYP removed from her colon. NEXT TCS in 10 years.

## 2018-10-19 ENCOUNTER — Telehealth: Payer: Self-pay | Admitting: Gastroenterology

## 2018-10-19 ENCOUNTER — Encounter (HOSPITAL_COMMUNITY): Payer: Self-pay | Admitting: Gastroenterology

## 2018-10-19 NOTE — Telephone Encounter (Signed)
LMOM to call.

## 2018-10-19 NOTE — Telephone Encounter (Signed)
[phone] patient returned call

## 2018-10-19 NOTE — Telephone Encounter (Signed)
See results, pt aware.

## 2018-10-19 NOTE — Telephone Encounter (Signed)
PT is aware of results.  

## 2018-10-28 NOTE — Telephone Encounter (Signed)
Reminder in epic °

## 2019-01-04 ENCOUNTER — Other Ambulatory Visit (HOSPITAL_COMMUNITY): Payer: Self-pay | Admitting: Physician Assistant

## 2019-01-04 DIAGNOSIS — Z1231 Encounter for screening mammogram for malignant neoplasm of breast: Secondary | ICD-10-CM

## 2019-03-11 ENCOUNTER — Other Ambulatory Visit: Payer: Self-pay

## 2019-03-11 ENCOUNTER — Ambulatory Visit (HOSPITAL_COMMUNITY)
Admission: RE | Admit: 2019-03-11 | Discharge: 2019-03-11 | Disposition: A | Discharge status: Home / Self Care | Payer: BC Managed Care – PPO | Source: Ambulatory Visit | Attending: Physician Assistant | Admitting: Physician Assistant

## 2019-03-11 DIAGNOSIS — Z1231 Encounter for screening mammogram for malignant neoplasm of breast: Secondary | ICD-10-CM | POA: Insufficient documentation

## 2019-04-14 ENCOUNTER — Ambulatory Visit: Payer: BC Managed Care – PPO | Attending: Internal Medicine

## 2019-04-14 DIAGNOSIS — Z23 Encounter for immunization: Secondary | ICD-10-CM | POA: Insufficient documentation

## 2019-04-14 NOTE — Progress Notes (Signed)
   Covid-19 Vaccination Clinic  Name:  Brooke Doyle    MRN: [id number] DOB: 01-08-1961  04/14/2019  Brooke Doyle was observed post Covid-19 immunization for 15 minutes without incident. She was provided with Vaccine Information Sheet and instruction to access the V-Safe system.   Brooke Doyle was instructed to call 911 with any severe reactions post vaccine: Marland Kitchen Difficulty breathing  . Swelling of face and throat  . A fast heartbeat  . A bad rash all over body  . Dizziness and weakness   Immunizations Administered    Name Date Dose VIS Date Route   Pfizer COVID-19 Vaccine 04/14/2019  6:20 PM 0.3 mL 01/18/2019 Intramuscular   Manufacturer: [employer], [employer]   Lot: [id number]   NDC: [address]-3

## 2019-05-05 ENCOUNTER — Ambulatory Visit: Payer: BC Managed Care – PPO | Attending: Internal Medicine

## 2019-05-05 DIAGNOSIS — Z23 Encounter for immunization: Secondary | ICD-10-CM

## 2019-05-05 NOTE — Progress Notes (Signed)
   Covid-19 Vaccination Clinic  Name:  Brooke Doyle    MRN: [id number] DOB: 1960-10-12  05/05/2019  [name] was observed post Covid-19 immunization for 15 minutes without incident. She was provided with Vaccine Information Sheet and instruction to access the V-Safe system.   [name] was instructed to call 911 with any severe reactions post vaccine: Marland Kitchen Difficulty breathing  . Swelling of face and throat  . A fast heartbeat  . A bad rash all over body  . Dizziness and weakness   Immunizations Administered    Name Date Dose VIS Date Route   Pfizer COVID-19 Vaccine 05/05/2019  4:23 PM 0.3 mL 01/18/2019 Intramuscular   Manufacturer: [employer], [employer]   Lot: [id number]   NDC: [address]-3

## 2019-11-12 ENCOUNTER — Other Ambulatory Visit (HOSPITAL_COMMUNITY): Payer: Self-pay | Admitting: Physician Assistant

## 2019-11-12 DIAGNOSIS — Z1231 Encounter for screening mammogram for malignant neoplasm of breast: Secondary | ICD-10-CM

## 2020-03-11 ENCOUNTER — Ambulatory Visit (HOSPITAL_COMMUNITY): Payer: BC Managed Care – PPO

## 2020-03-18 ENCOUNTER — Other Ambulatory Visit: Payer: Self-pay

## 2020-03-18 ENCOUNTER — Encounter (HOSPITAL_COMMUNITY): Payer: Self-pay

## 2020-03-18 ENCOUNTER — Ambulatory Visit (HOSPITAL_COMMUNITY)
Admission: RE | Admit: 2020-03-18 | Discharge: 2020-03-18 | Disposition: A | Discharge status: Home / Self Care | Payer: BC Managed Care – PPO | Source: Ambulatory Visit | Attending: Physician Assistant | Admitting: Physician Assistant

## 2020-03-18 DIAGNOSIS — Z1231 Encounter for screening mammogram for malignant neoplasm of breast: Secondary | ICD-10-CM | POA: Insufficient documentation

## 2020-04-09 ENCOUNTER — Other Ambulatory Visit (HOSPITAL_COMMUNITY): Payer: Self-pay | Admitting: Physician Assistant

## 2020-04-09 DIAGNOSIS — M8580 Other specified disorders of bone density and structure, unspecified site: Secondary | ICD-10-CM

## 2020-05-07 ENCOUNTER — Ambulatory Visit (HOSPITAL_COMMUNITY)
Admission: RE | Admit: 2020-05-07 | Discharge: 2020-05-07 | Disposition: A | Discharge status: Home / Self Care | Payer: BC Managed Care – PPO | Source: Ambulatory Visit | Attending: Physician Assistant | Admitting: Physician Assistant

## 2020-05-07 ENCOUNTER — Other Ambulatory Visit: Payer: Self-pay

## 2020-05-07 DIAGNOSIS — M8580 Other specified disorders of bone density and structure, unspecified site: Secondary | ICD-10-CM | POA: Diagnosis present

## 2021-02-25 ENCOUNTER — Other Ambulatory Visit (HOSPITAL_COMMUNITY): Payer: Self-pay | Admitting: Physician Assistant

## 2021-02-25 DIAGNOSIS — Z1231 Encounter for screening mammogram for malignant neoplasm of breast: Secondary | ICD-10-CM

## 2021-04-01 ENCOUNTER — Ambulatory Visit (HOSPITAL_COMMUNITY): Payer: BC Managed Care – PPO

## 2021-04-29 ENCOUNTER — Ambulatory Visit (HOSPITAL_COMMUNITY): Payer: BC Managed Care – PPO

## 2021-07-14 ENCOUNTER — Ambulatory Visit (HOSPITAL_COMMUNITY): Payer: BC Managed Care – PPO

## 2021-07-21 ENCOUNTER — Ambulatory Visit (HOSPITAL_COMMUNITY)
Admission: RE | Admit: 2021-07-21 | Discharge: 2021-07-21 | Disposition: A | Discharge status: Home / Self Care | Payer: BC Managed Care – PPO | Source: Ambulatory Visit | Attending: Physician Assistant | Admitting: Physician Assistant

## 2021-07-21 DIAGNOSIS — Z1231 Encounter for screening mammogram for malignant neoplasm of breast: Secondary | ICD-10-CM | POA: Diagnosis present

## 2022-05-17 ENCOUNTER — Other Ambulatory Visit (HOSPITAL_COMMUNITY): Payer: Self-pay | Admitting: Physician Assistant

## 2022-05-17 DIAGNOSIS — Z1231 Encounter for screening mammogram for malignant neoplasm of breast: Secondary | ICD-10-CM

## 2022-07-25 ENCOUNTER — Ambulatory Visit (HOSPITAL_COMMUNITY)
Admission: RE | Admit: 2022-07-25 | Discharge: 2022-07-25 | Disposition: A | Discharge status: Home / Self Care | Payer: BC Managed Care – PPO | Source: Ambulatory Visit | Attending: Physician Assistant | Admitting: Physician Assistant

## 2022-07-25 DIAGNOSIS — Z1231 Encounter for screening mammogram for malignant neoplasm of breast: Secondary | ICD-10-CM | POA: Insufficient documentation

## 2023-06-05 ENCOUNTER — Other Ambulatory Visit (HOSPITAL_COMMUNITY): Payer: Self-pay | Admitting: Physician Assistant

## 2023-06-05 DIAGNOSIS — Z1231 Encounter for screening mammogram for malignant neoplasm of breast: Secondary | ICD-10-CM

## 2023-08-09 ENCOUNTER — Ambulatory Visit (HOSPITAL_COMMUNITY)
Admission: RE | Admit: 2023-08-09 | Discharge: 2023-08-09 | Disposition: A | Discharge status: Home / Self Care | Payer: Self-pay | Source: Ambulatory Visit | Attending: Physician Assistant | Admitting: Physician Assistant

## 2023-08-09 DIAGNOSIS — Z1231 Encounter for screening mammogram for malignant neoplasm of breast: Secondary | ICD-10-CM | POA: Insufficient documentation

## 2024-03-09 IMAGING — MG MM DIGITAL SCREENING BILAT W/ TOMO AND CAD
8 series · 9 of 24 positions shown · non-contrast
Comparison: Previous exam(s).

CLINICAL DATA: Screening.

EXAM:
DIGITAL SCREENING BILATERAL MAMMOGRAM WITH TOMOSYNTHESIS AND CAD
TECHNIQUE: Bilateral screening digital craniocaudal and mediolateral oblique
mammograms were obtained. Bilateral screening digital breast
tomosynthesis was performed. The images were evaluated with
computer-aided detection.

[L CC synth-2D]
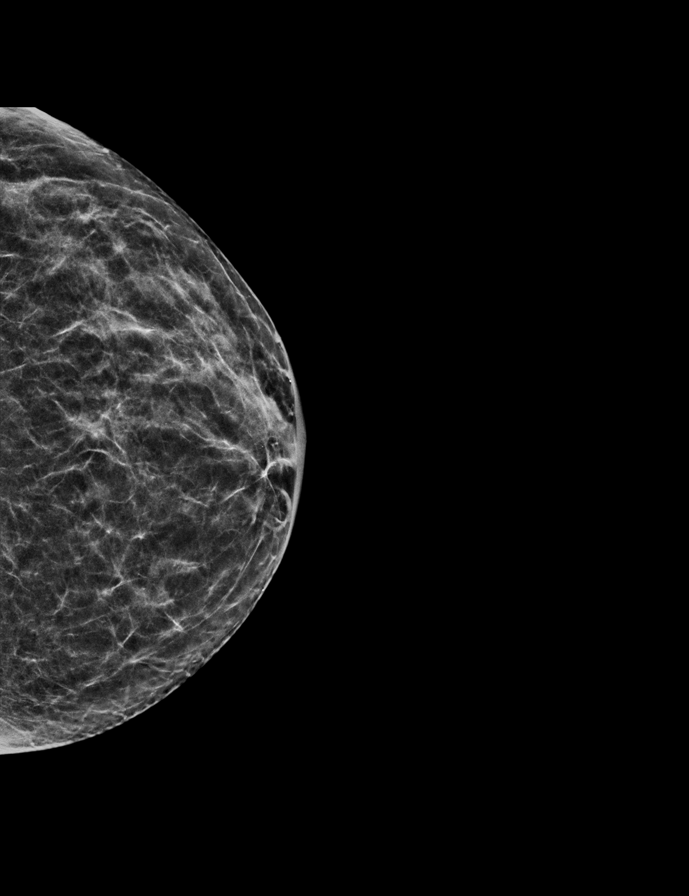

[R MLO synth-2D]
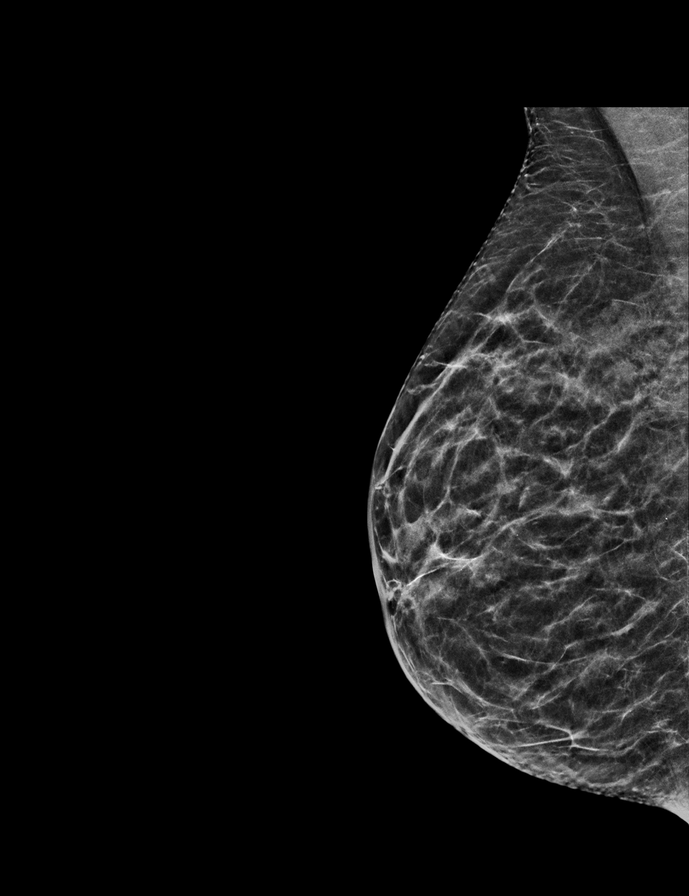

[R CC synth-2D]
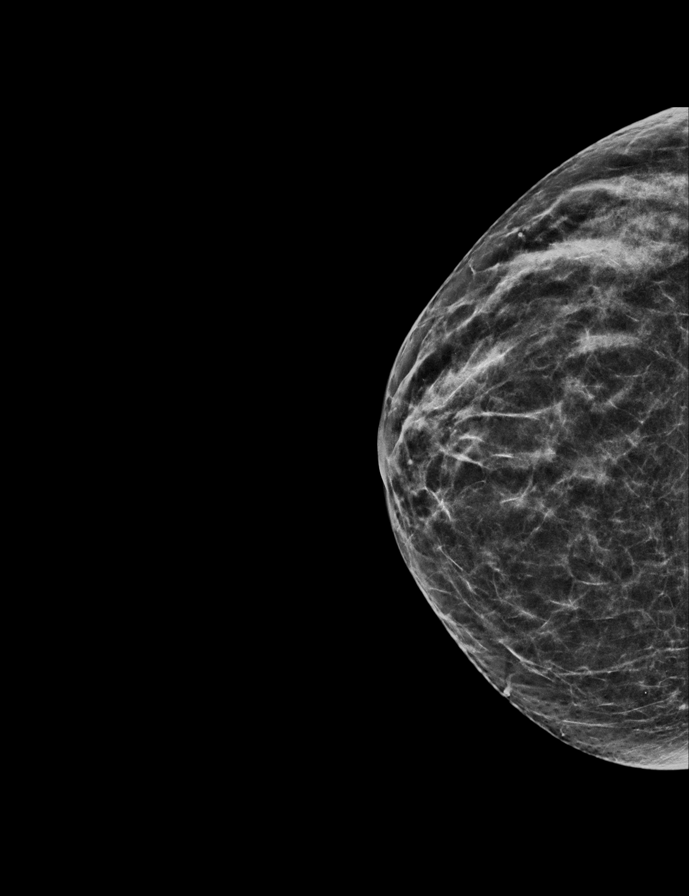

[L MLO synth-2D]
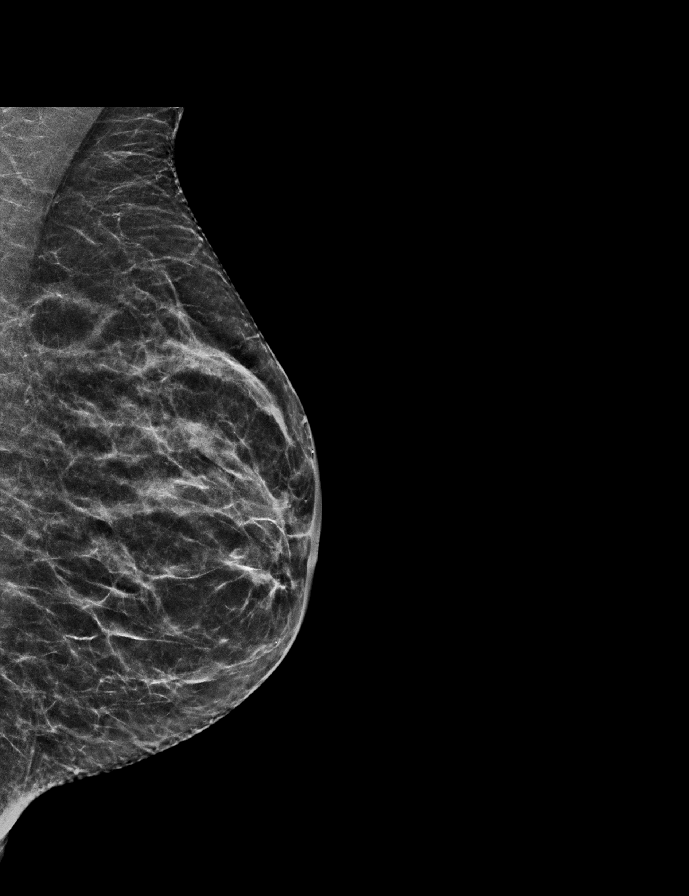

[R MLO tomo · 2 of 48 frames shown]
[frame 16/48]
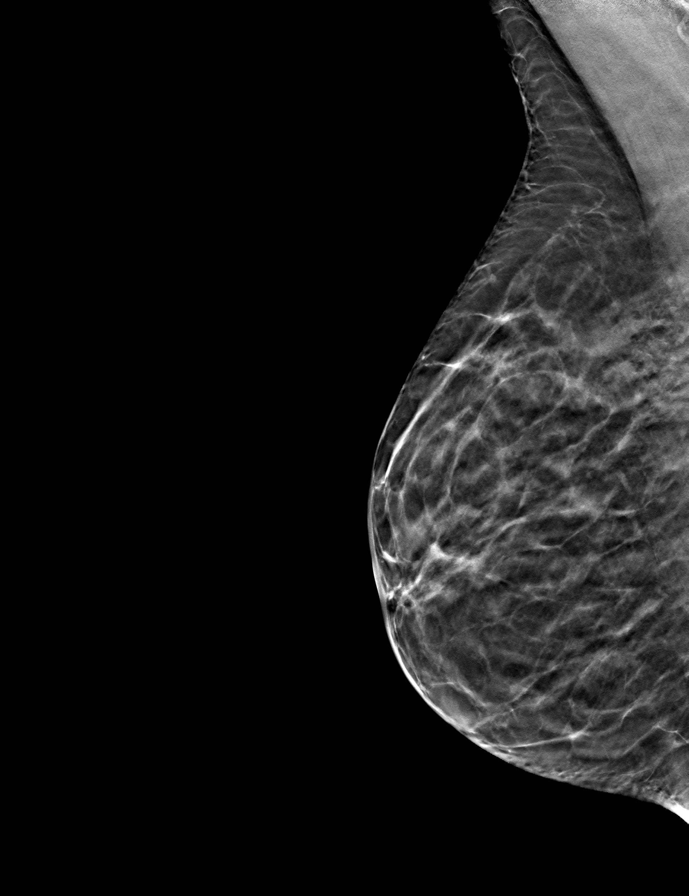
[frame 25/48]
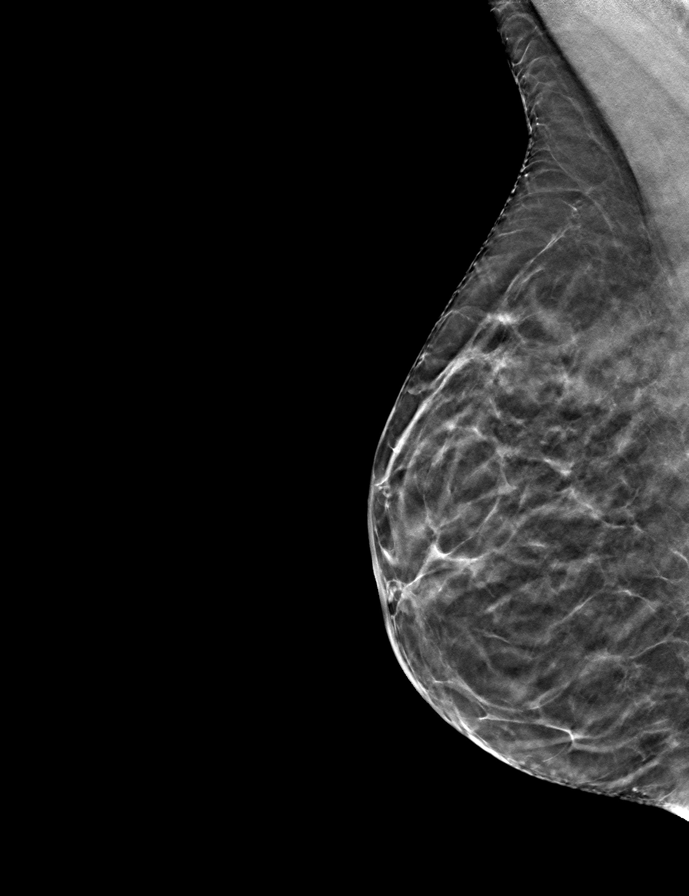

[R CC tomo · tomo slice 25/50.0]
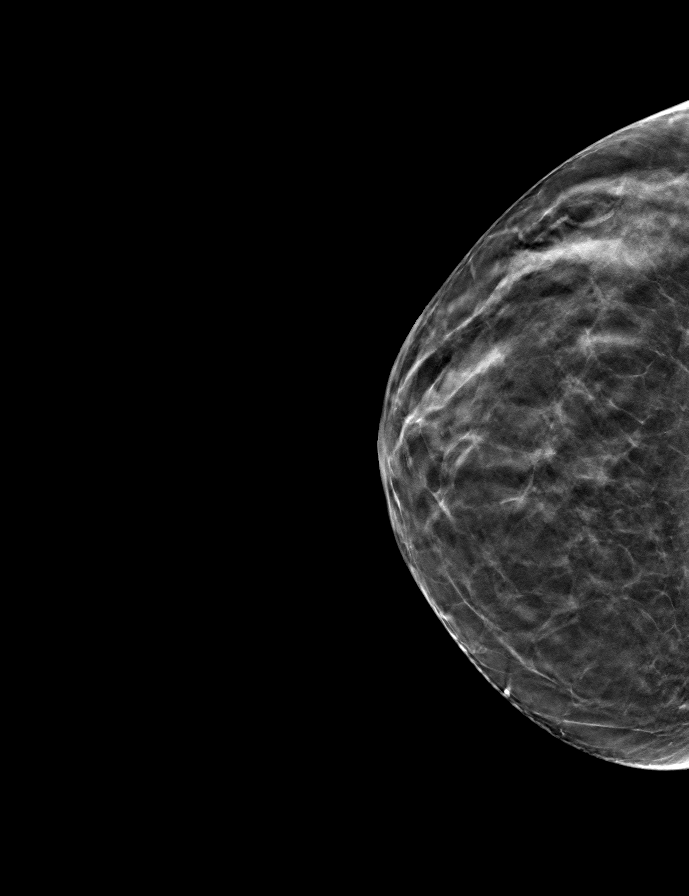

[L CC tomo · tomo slice 24/47.0]
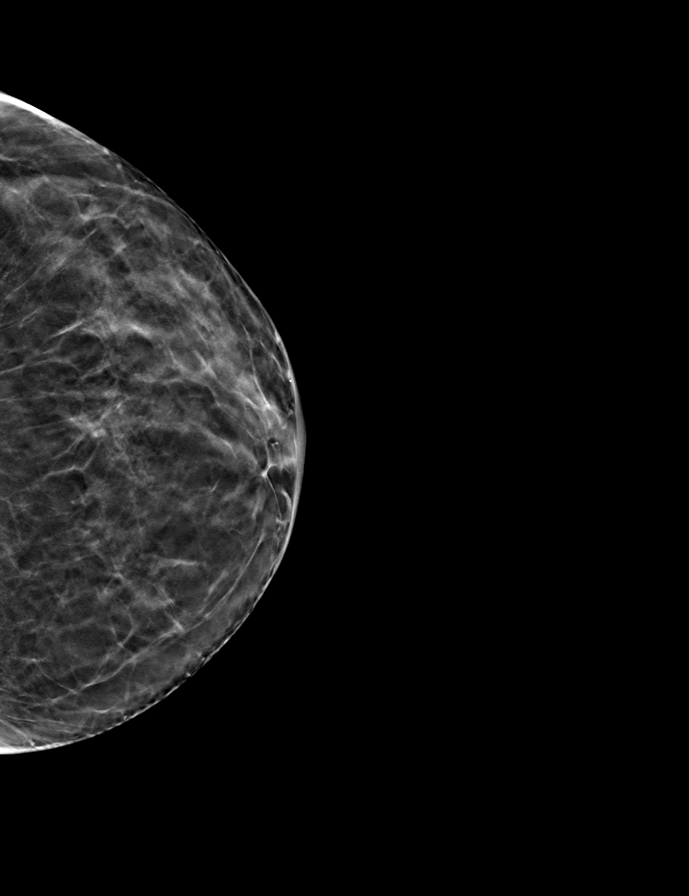

[L MLO tomo · tomo slice 25/49.0]
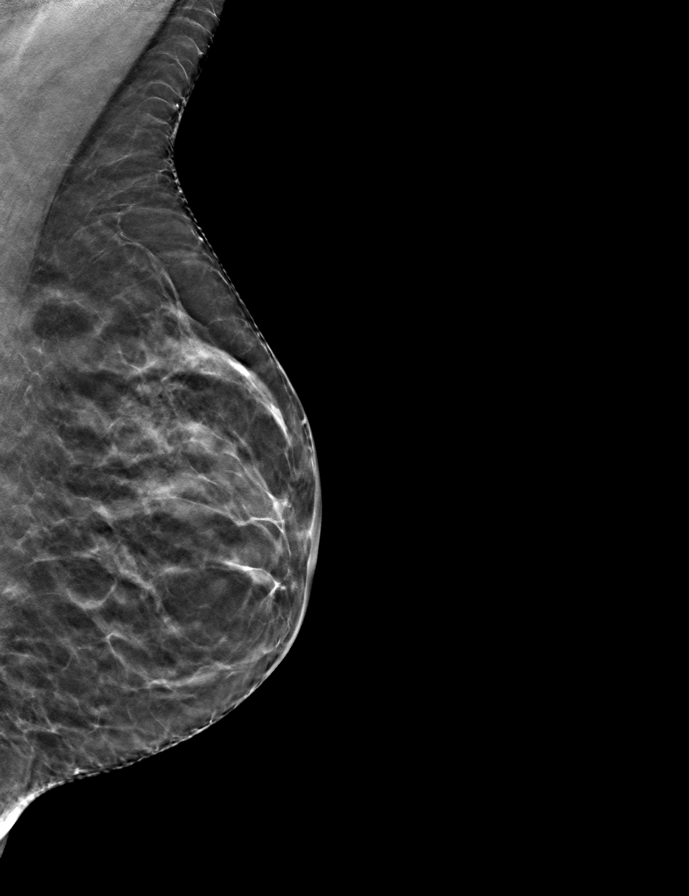

[9 of 24 positions shown; findings below may reference images not displayed]

ACR Breast Density Category b: There are scattered areas of
fibroglandular density.
FINDINGS: There are no findings suspicious for malignancy.
IMPRESSION: No mammographic evidence of malignancy. A result letter of this
screening mammogram will be mailed directly to the patient.

RECOMMENDATION:
Screening mammogram in one year. (Code:51-O-LD2)

BI-RADS CATEGORY  1: Negative.
